# Patient Record
Sex: Male | Born: 2000 | Race: Black or African American | Hispanic: No | Marital: Single | Smoking: Never smoker
Health system: Southern US, Community
[De-identification: ages and names within clinical notes are randomized; demographics above are authoritative.]

## PROBLEM LIST (undated history)

## (undated) DIAGNOSIS — J45909 Unspecified asthma, uncomplicated: Secondary | ICD-10-CM

---
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
## 2023-10-14 ENCOUNTER — Encounter (HOSPITAL_COMMUNITY): Payer: Self-pay

## 2023-10-14 ENCOUNTER — Other Ambulatory Visit: Payer: Self-pay

## 2023-10-14 ENCOUNTER — Emergency Department (HOSPITAL_COMMUNITY)
Admission: EM | Admit: 2023-10-14 | Discharge: 2023-10-14 | Disposition: A | Discharge status: Home / Self Care | Payer: PRIVATE HEALTH INSURANCE

## 2023-10-14 ENCOUNTER — Emergency Department (HOSPITAL_COMMUNITY): Payer: PRIVATE HEALTH INSURANCE

## 2023-10-14 DIAGNOSIS — Z7951 Long term (current) use of inhaled steroids: Secondary | ICD-10-CM | POA: Diagnosis not present

## 2023-10-14 DIAGNOSIS — J45909 Unspecified asthma, uncomplicated: Secondary | ICD-10-CM | POA: Insufficient documentation

## 2023-10-14 DIAGNOSIS — J069 Acute upper respiratory infection, unspecified: Secondary | ICD-10-CM | POA: Insufficient documentation

## 2023-10-14 DIAGNOSIS — R0602 Shortness of breath: Secondary | ICD-10-CM | POA: Diagnosis present

## 2023-10-14 LAB — RESP PANEL BY RT-PCR (RSV, FLU A&B, COVID)  RVPGX2
Influenza A by PCR: NEGATIVE
Influenza B by PCR: NEGATIVE
Resp Syncytial Virus by PCR: NEGATIVE
SARS Coronavirus 2 by RT PCR: NEGATIVE

## 2023-10-14 LAB — URINALYSIS, ROUTINE W REFLEX MICROSCOPIC
Bilirubin Urine: NEGATIVE
Glucose, UA: 50 mg/dL — AB
Hgb urine dipstick: NEGATIVE
Ketones, ur: NEGATIVE mg/dL
Leukocytes,Ua: NEGATIVE
Nitrite: NEGATIVE
Protein, ur: NEGATIVE mg/dL
Specific Gravity, Urine: 1.023 (ref 1.005–1.030)
pH: 5 (ref 5.0–8.0)

## 2023-10-14 MED ORDER — PREDNISONE 10 MG (21) PO TBPK: ORAL_TABLET | Freq: Every day | ORAL | 0 refills | Status: AC

## 2023-10-14 MED ORDER — BENZONATATE 100 MG PO CAPS: 100.0000 mg | ORAL_CAPSULE | Freq: Three times a day (TID) | ORAL | 0 refills | Status: AC

## 2023-10-14 MED ORDER — ALBUTEROL SULFATE HFA 108 (90 BASE) MCG/ACT IN AERS
2.0000 | INHALATION_SPRAY | RESPIRATORY_TRACT | Status: DC | PRN
  Administered 2023-10-14: 2 via RESPIRATORY_TRACT
  Filled 2023-10-14: qty 6.7

## 2023-10-14 MED ORDER — ALBUTEROL SULFATE HFA 108 (90 BASE) MCG/ACT IN AERS: 1.0000 | INHALATION_SPRAY | Freq: Four times a day (QID) | RESPIRATORY_TRACT | 1 refills | Status: AC | PRN

## 2023-10-14 NOTE — ED Provider Notes (Signed)
  EMERGENCY DEPARTMENT AT [facility] Provider Note   CSN: [id number] Arrival date & time: 10/14/23  1836     Patient presents with: Asthma   Richard Maldonado is a 23 y.o. male has medical history significant for asthma presents today for asthma flare that the patient reports started last Thursday.  Patient reports coughing and episodes of emesis from coughing.  Patient denies productive cough, fever, chills, body aches, runny nose, sore throat, nasal congestion, or headache.  Patient reports going to [facility] and given prednisone  and DuoNeb treatment with minimal relief.  Patient was discharged home.  Patient reports that his rescue inhaler has run out.    Asthma Associated symptoms include shortness of breath.       Prior to Admission medications   Medication Sig Start Date End Date Taking? Authorizing Provider  albuterol  (VENTOLIN  HFA) 108 (90 Base) MCG/ACT inhaler Inhale 1-2 puffs into the lungs every 6 (six) hours as needed for wheezing or shortness of breath. 10/14/23  Yes Johann Santone N, PA-C  benzonatate  (TESSALON ) 100 MG capsule Take 1 capsule (100 mg total) by mouth every 8 (eight) hours. 10/14/23  Yes Francis Ileana SAILOR, PA-C  predniSONE  (STERAPRED UNI-PAK 21 TAB) 10 MG (21) TBPK tablet Take by mouth daily. Take 6 tabs by mouth daily  for 2 days, then 5 tabs for 2 days, then 4 tabs for 2 days, then 3 tabs for 2 days, 2 tabs for 2 days, then 1 tab by mouth daily for 2 days 10/14/23  Yes Francis Ileana SAILOR, PA-C  albuterol  (PROVENTIL ) (2.5 MG/3ML) 0.083% nebulizer solution Take 3 mLs (2.5 mg total) by nebulization every 6 (six) hours as needed for wheezing or shortness of breath. 04/30/20   Avegno, Komlanvi S, FNP  benzonatate  (TESSALON ) 100 MG capsule Take 1 capsule (100 mg total) by mouth every 8 (eight) hours. 04/30/20   Avegno, Komlanvi S, FNP  cetirizine (ZYRTEC) 10 MG tablet Take 10 mg by mouth daily as needed. Allergies 01/17/15   [provider]   fluticasone (FLONASE) 50 MCG/ACT nasal spray Place 1 spray into both nostrils daily. 01/26/15   [provider]  ibuprofen  (ADVIL ,MOTRIN ) 600 MG tablet Take 600 mg by mouth 3 (three) times daily. 01/26/15   [provider]  ibuprofen  (CHILDRENS MOTRIN ) 50 MG chewable tablet Chew 4 tablets (200 mg total) by mouth every 6 (six) hours as needed. Patient not taking: Reported on 02/02/2015 02/03/14   Jamelle Lorrayne HERO, NP  PROVENTIL  HFA 108 (90 BASE) MCG/ACT inhaler Inhale 2 puffs into the lungs every 4 (four) hours. 01/27/15   [provider]  QVAR 40 MCG/ACT inhaler Inhale 40 mcg into the lungs daily. 11/29/14   [provider]  QVAR 80 MCG/ACT inhaler Inhale 80 mcg into the lungs as directed. On football practice or game days 12/02/14   [provider]    Allergies: Patient has no known allergies.    Review of Systems  Respiratory:  Positive for shortness of breath and wheezing.     Updated Vital Signs BP 129/81   Pulse 80   Temp 98.3 F (36.8 C)   Resp 12   Ht 6' (1.829 m)   Wt 95.3 kg   SpO2 95%   BMI 28.48 kg/m   Physical Exam Vitals and nursing note reviewed.  Constitutional:      General: He is not in acute distress.    Appearance: Normal appearance. He is well-developed.  HENT:  Head: Normocephalic and atraumatic.     Right Ear: External ear normal.     Left Ear: External ear normal.     Nose: Nose normal.     Mouth/Throat:     Mouth: Mucous membranes are moist.     Pharynx: Oropharynx is clear.   Eyes:     Conjunctiva/sclera: Conjunctivae normal.    Cardiovascular:     Rate and Rhythm: Regular rhythm.     Pulses: Normal pulses.     Heart sounds: Normal heart sounds. No murmur heard. Pulmonary:     Effort: Pulmonary effort is normal. No respiratory distress.     Breath sounds: Normal breath sounds.  Abdominal:     Palpations: Abdomen is soft.     Tenderness: There is no abdominal tenderness.   Musculoskeletal:         General: No swelling.     Cervical back: Neck supple.   Skin:    General: Skin is warm and dry.     Capillary Refill: Capillary refill takes less than 2 seconds.   Neurological:     General: No focal deficit present.     Mental Status: He is alert and oriented to person, place, and time.   Psychiatric:        Mood and Affect: Mood normal.     (all labs ordered are listed, but only abnormal results are displayed) Labs Reviewed  URINALYSIS, ROUTINE W REFLEX MICROSCOPIC - Abnormal; Notable for the following components:      Result Value   Glucose, UA 50 (*)    All other components within normal limits  RESP PANEL BY RT-PCR (RSV, FLU A&B, COVID)  [id number]    EKG: EKG Interpretation Date/Time:  Monday October 14 2023 19:25:08 EDT Ventricular Rate:  80 PR Interval:  130 QRS Duration:  97 QT Interval:  385 QTC Calculation: 445 R Axis:   107  Text Interpretation: Sinus rhythm Normal intervals No STEMI No previous ECGs available Confirmed by Gennaro Bouchard ([id number]) on 10/14/2023 8:35:19 PM  Radiology: DG Chest 2 View Result Date: 10/14/2023 CLINICAL DATA:  Shortness of breath.  Asthma flare. EXAM: CHEST - 2 VIEW COMPARISON:  07/14/2010. FINDINGS: The heart size and mediastinal contours are within normal limits. No consolidation, effusion, or pneumothorax is seen. No acute osseous abnormality. IMPRESSION: No active cardiopulmonary disease. Electronically Signed   By: Leita Birmingham M.D.   On: 10/14/2023 19:17     Procedures   Medications Ordered in the ED  albuterol  (VENTOLIN  HFA) 108 (90 Base) MCG/ACT inhaler 2 puff (2 puffs Inhalation Given 10/14/23 1849)                                    Medical Decision Making Amount and/or Complexity of Data Reviewed Labs: ordered. Radiology: ordered.  Risk Prescription drug management.   This patient presents to the ED for concern of shortness of breath/asthma differential diagnosis includes COVID, flu, RSV, asthma exacerbation,  pneumonia, upper respiratory infection    Additional history obtained   Additional history obtained from Electronic Medical Record External records from outside source obtained and reviewed including Care Everywhere   Lab Tests:  I Ordered, and personally interpreted labs.  The pertinent results include:  Resp panel: EKG which showed sinus rhythm   Imaging Studies ordered:  I ordered imaging studies including chest x-ray I independently visualized and interpreted imaging which showed no active cardiopulmonary disease I  agree with the radiologist interpretation   Medicines ordered and prescription drug management:  I ordered medication including albuterol     I have reviewed the patients home medicines and have made adjustments as needed   Problem List / ED Course:  Patient able to ambulate with pulse ox on room air and maintain SpO2 of 93% or greater. Considered for admission or further workup however patient's vital signs, physical exam, labs, and imaging have been reassuring.  Patient's symptoms likely due to upper respiratory infection.  Patient given Tessalon  Perles for cough, Sterapred taper, and rescue inhaler refills.  Patient given return precautions.  I feel patient is safe for discharge at this time.      Final diagnoses:  Upper respiratory tract infection, unspecified type    ED Discharge Orders          Ordered    benzonatate  (TESSALON ) 100 MG capsule  Every 8 hours        10/14/23 2126    predniSONE  (STERAPRED UNI-PAK 21 TAB) 10 MG (21) TBPK tablet  Daily        10/14/23 2126    albuterol  (VENTOLIN  HFA) 108 (90 Base) MCG/ACT inhaler  Every 6 hours PRN        10/14/23 2126               Francis Ileana SAILOR, PA-C 10/14/23 2128    Kammerer, Megan L, DO 10/15/23 AMOS

## 2023-10-14 NOTE — ED Notes (Signed)
 ED Provider at bedside.

## 2023-10-14 NOTE — ED Notes (Signed)
 Ambulated patient in the hallway for about 150 feet. SaO2 remained at 93% or above on RA. Pt did not exhibit any evidence of increased work of breathing or shortness of breath. Cough remains non-productive.

## 2023-10-14 NOTE — ED Notes (Signed)
 Patient transported to X-ray via wheelchair. Non-productive cough noted upon return.

## 2023-10-14 NOTE — ED Triage Notes (Signed)
 Pt arrived via POV c/o on-going asthma flare up since last Thursday. Pt reports he went to [facility] and was given prednisone  and a Duoneb Treatment with minimal relief. Pt was told his airway was tight and discharged home. Pt reports his rescue inhaler has run out.

## 2023-10-14 NOTE — Discharge Instructions (Addendum)
 Today you were seen for coughing and shortness of breath.  I suspect you likely have a viral upper respiratory infection.  You have been prescribed a prednisone  taper, albuterol  inhaler, and Tessalon  Perles for cough.  Please follow-up with your PCP if your symptoms persist for further evaluation workup.  Thank you for letting us  treat you today. After reviewing your labs and imaging, I feel you are safe to go home. Please follow up with your PCP in the next several days and provide them with your records from this visit. Return to the Emergency Room if pain becomes severe or symptoms worsen.
